# Patient Record
Sex: Female | Born: 1970 | Race: White | Hispanic: No | Marital: Married
Health system: Southern US, Community
[De-identification: ages and names within clinical notes are randomized; demographics above are authoritative.]

---

## 2017-08-29 ENCOUNTER — Ambulatory Visit: Payer: Self-pay | Admitting: Adult Health

## 2019-01-15 ENCOUNTER — Other Ambulatory Visit: Payer: Self-pay | Admitting: Obstetrics and Gynecology

## 2019-01-15 DIAGNOSIS — N6310 Unspecified lump in the right breast, unspecified quadrant: Secondary | ICD-10-CM

## 2019-01-16 ENCOUNTER — Encounter (INDEPENDENT_AMBULATORY_CARE_PROVIDER_SITE_OTHER): Payer: Self-pay

## 2019-01-16 ENCOUNTER — Ambulatory Visit
Admission: RE | Admit: 2019-01-16 | Discharge: 2019-01-16 | Disposition: A | Discharge status: Home / Self Care | Payer: No Typology Code available for payment source | Source: Ambulatory Visit | Attending: Obstetrics and Gynecology | Admitting: Obstetrics and Gynecology

## 2019-01-16 ENCOUNTER — Other Ambulatory Visit: Payer: Self-pay | Admitting: Obstetrics and Gynecology

## 2019-01-16 ENCOUNTER — Other Ambulatory Visit: Payer: Self-pay

## 2019-01-16 DIAGNOSIS — N6310 Unspecified lump in the right breast, unspecified quadrant: Secondary | ICD-10-CM

## 2019-01-16 DIAGNOSIS — N6320 Unspecified lump in the left breast, unspecified quadrant: Secondary | ICD-10-CM

## 2019-01-22 ENCOUNTER — Other Ambulatory Visit: Payer: Self-pay

## 2019-02-09 ENCOUNTER — Other Ambulatory Visit: Payer: Self-pay

## 2019-12-21 ENCOUNTER — Other Ambulatory Visit: Payer: Self-pay | Admitting: Obstetrics and Gynecology

## 2019-12-21 DIAGNOSIS — Z1231 Encounter for screening mammogram for malignant neoplasm of breast: Secondary | ICD-10-CM

## 2020-01-15 ENCOUNTER — Ambulatory Visit
Admission: RE | Admit: 2020-01-15 | Discharge: 2020-01-15 | Disposition: A | Discharge status: Home / Self Care | Payer: Self-pay | Source: Ambulatory Visit

## 2020-01-15 ENCOUNTER — Other Ambulatory Visit: Payer: Self-pay

## 2020-01-15 DIAGNOSIS — Z1231 Encounter for screening mammogram for malignant neoplasm of breast: Secondary | ICD-10-CM

## 2020-04-28 IMAGING — US ULTRASOUND LEFT BREAST LIMITED
1 series · 10 of 10 positions shown · non-contrast
Comparison: None

CLINICAL DATA: Patient presents for evaluation of palpable
abnormality within the upper-outer right breast.

EXAM:
DIGITAL DIAGNOSTIC BILATERAL MAMMOGRAM WITH CAD AND TOMO
ULTRASOUND BILATERAL BREAST

[Series 1: ultrasound left breast limited · 0.07mm/px · 10 of 10 slices shown]
[im 1/10]
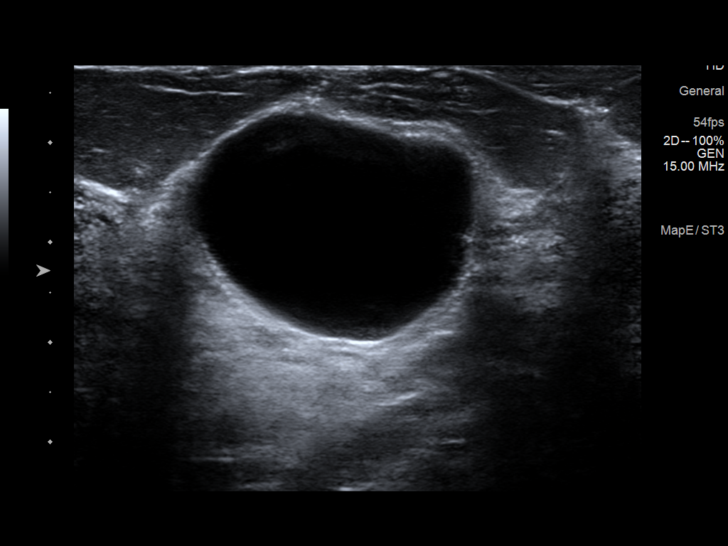
[im 2/10]
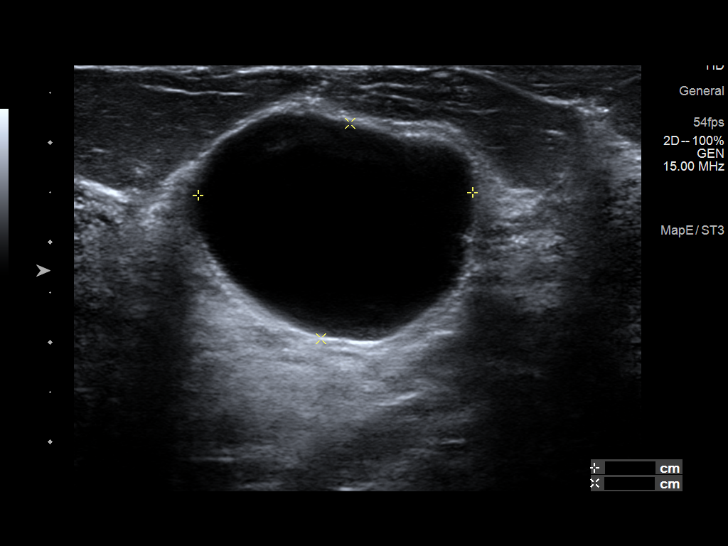
[im 3/10]
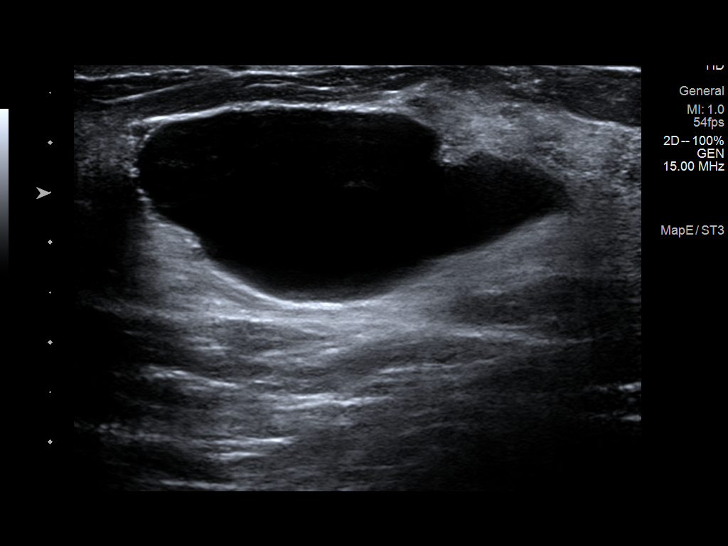
[im 4/10]
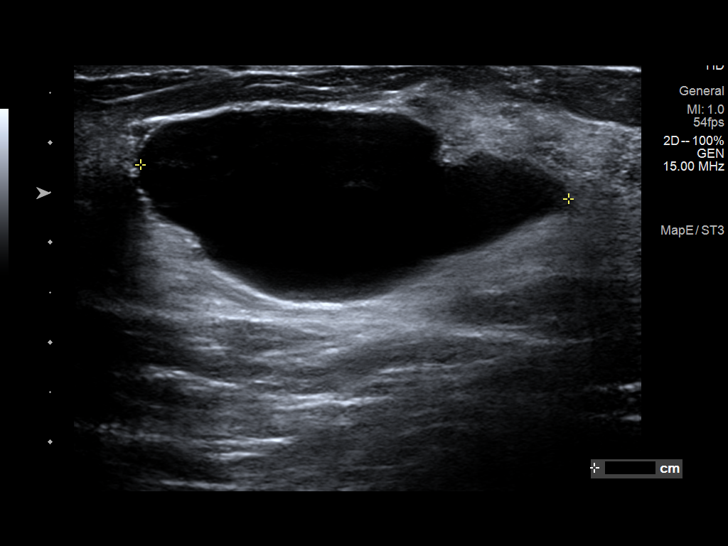
[im 5/10]
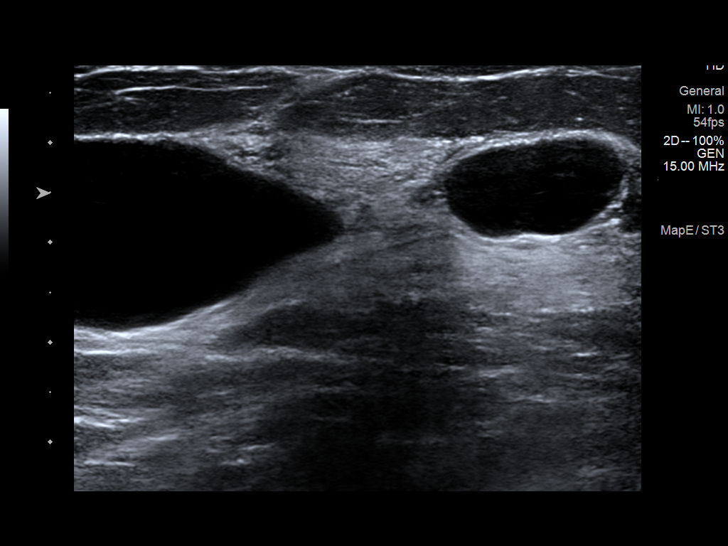
[im 6/10]
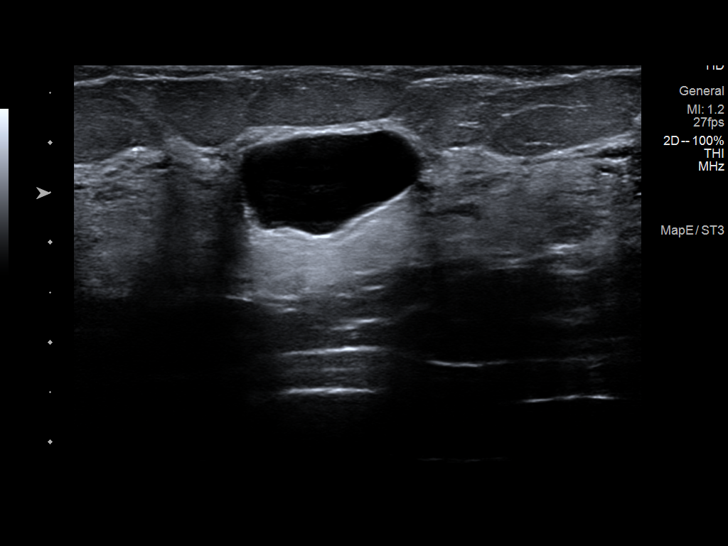
[im 7/10]
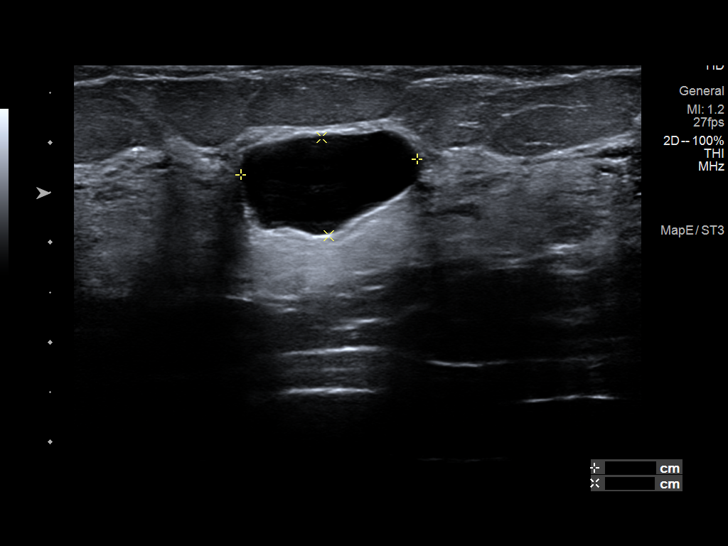
[im 8/10]
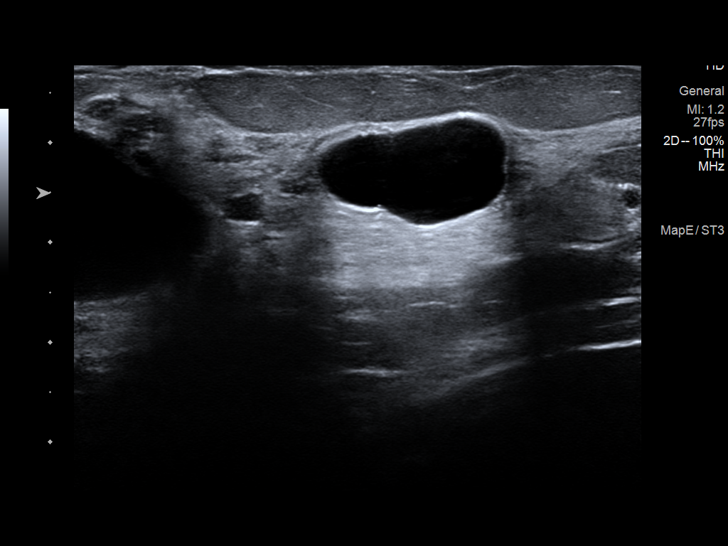
[im 9/10]
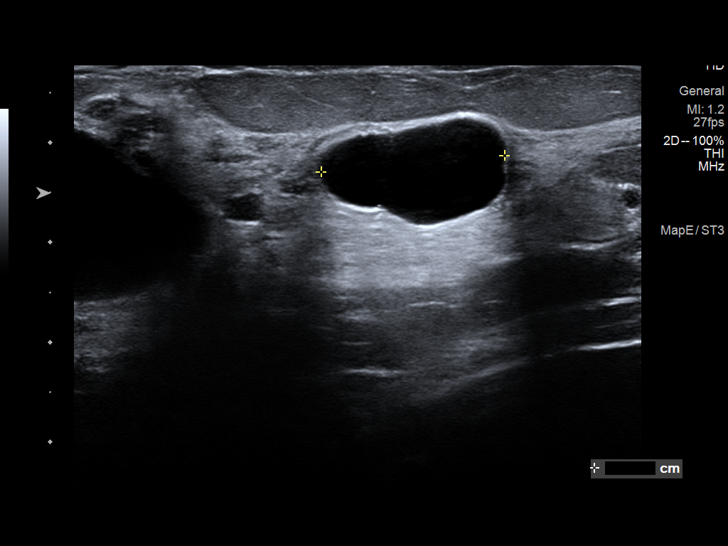
[im 10/10]
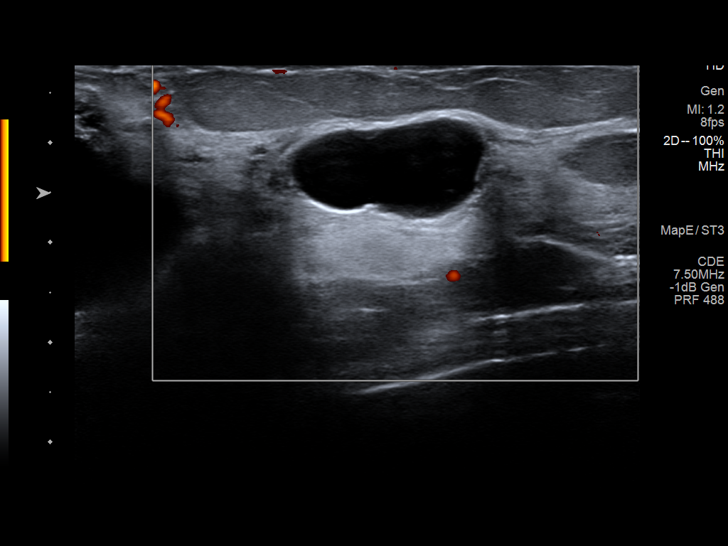

[10 of 10 positions shown; findings below may reference images not displayed]

ACR Breast Density Category c: The breast tissue is heterogeneously
dense, which may obscure small masses.
FINDINGS: Underlying the palpable marker within the upper-outer right breast
are multiple oval circumscribed masses. Additionally, there are
multiple oval circumscribed masses within the left breast. Given the
bilateral nature of these masses, these are suggestive of a benign
process. No suspicious calcifications or distortion within either
breast.

Mammographic images were processed with CAD.

Targeted ultrasound is performed, showing a 3.1 x 2.2 x 3.9 cm cyst
right breast 10 o'clock position 3 cm from nipple corresponding with
palpable abnormality.

There is an adjacent 3.2 x 1.5 x 3.0 cm simple cyst right breast 10
o'clock position 3 cm from the nipple.

There is a 2.9 x 1.5 x 3.9 cm simple cyst right breast 10 o'clock
position retroareolar location.

Within the left breast there is a 2.8 x 2.2 x 4.3 cm simple cyst 3
o'clock position 2 cm from nipple.

Within the left breast 3 o'clock position 4 cm from nipple there is
a 1.8 x 1.0 x 1.8 cm simple cyst.
IMPRESSION: Palpable abnormality within the right breast corresponds with a
benign simple cyst.

Additional bilateral benign appearing simple cysts are demonstrated.

No mammographic evidence for malignancy.

RECOMMENDATION:
Continued clinical evaluation for right breast palpable abnormality.

Screening mammogram in one year.(Code:UQ-7-MIX)

I have discussed the findings and recommendations with the patient.
Results were also provided in writing at the conclusion of the
visit. If applicable, a reminder letter will be sent to the patient
regarding the next appointment.

BI-RADS CATEGORY  2: Benign.

## 2020-10-03 ENCOUNTER — Telehealth: Payer: Self-pay | Admitting: *Deleted

## 2020-10-03 NOTE — Telephone Encounter (Signed)
Pt notified HR on 10/01/20 that her spouse tested positive for Covid. She reports that she does not have any sx but she is in close contact with him. HR Tim spoke with pt that day and encouraged her to quarantine separately from spouse and she reported she is able to so her Day 1 is 10/01/20.   Spoke with pt by phone. She confirms all of the information above. She also confirms still asymptomatic herself. Due to limited testing availability recommended testing around day 5 but not required to return at this time as long as she remains asymptomatic.   Day 5 10/05/20. RTW 10/06/20 with 5 days additional strict mask use

## 2020-10-04 NOTE — Telephone Encounter (Signed)
Noted reviewed RN Rolly Salter note agree with plan of care

## 2020-10-06 NOTE — Telephone Encounter (Signed)
Confirmed with pt that she did RTW today. She feels well. Asymptomatic. Strict mask use thru 10/10/20. Not testing performed due to limited availability. No further needs or concerns. Closing encounter.

## 2020-10-06 NOTE — Telephone Encounter (Signed)
Noted patient returned to work asymptomatic.  Reviewed RN Rolly Salter note agree with plan of care.

## 2020-12-19 ENCOUNTER — Other Ambulatory Visit: Payer: Self-pay | Admitting: Obstetrics and Gynecology

## 2020-12-19 DIAGNOSIS — Z0000 Encounter for general adult medical examination without abnormal findings: Secondary | ICD-10-CM

## 2020-12-20 ENCOUNTER — Other Ambulatory Visit: Payer: Self-pay | Admitting: Obstetrics and Gynecology

## 2020-12-20 DIAGNOSIS — Z1231 Encounter for screening mammogram for malignant neoplasm of breast: Secondary | ICD-10-CM

## 2021-01-05 ENCOUNTER — Telehealth: Payer: Self-pay | Admitting: *Deleted

## 2021-01-05 NOTE — Telephone Encounter (Signed)
RN notified by HR that pt left work on Monday afternoon due to nausea. Was notified that same day that she was at a household over the weekend with someone with GI sx and she was unaware.  Monday progressively worsened to nausea and diarrhea. All of her sx resolved by Wednesday morning, 4/6. She feels well today, 4/7 and is ready to RTW.  She took a home Covid test Monday that was negative. Asked pt to take one more home test today since Day 3 and if negative, since all sx resovled >24hr, she is cleared to RTW tomorrow 4/8. If positive or sx recur, she will stay out of work and contact clinic.  As of 1615, pt did not answer return call for 2nd test results. Left VM reminding of return rules as stated above. Will f/u tomorrow to ensure RTW.

## 2021-01-06 ENCOUNTER — Encounter: Payer: Self-pay | Admitting: *Deleted

## 2021-01-06 NOTE — Telephone Encounter (Signed)
Reviewed RN Rolly Salter note agreed with plan of care.  Per communication with RN Rolly Salter today patient had negative covid test last night and returned to work today 01/06/2021.

## 2021-01-13 ENCOUNTER — Other Ambulatory Visit: Payer: Self-pay | Admitting: Obstetrics and Gynecology

## 2021-01-13 DIAGNOSIS — Z1231 Encounter for screening mammogram for malignant neoplasm of breast: Secondary | ICD-10-CM

## 2021-01-16 ENCOUNTER — Other Ambulatory Visit: Payer: Self-pay

## 2021-01-16 ENCOUNTER — Ambulatory Visit
Admission: RE | Admit: 2021-01-16 | Discharge: 2021-01-16 | Disposition: A | Discharge status: Home / Self Care | Payer: PRIVATE HEALTH INSURANCE | Source: Ambulatory Visit

## 2021-01-16 DIAGNOSIS — Z1231 Encounter for screening mammogram for malignant neoplasm of breast: Secondary | ICD-10-CM

## 2021-01-17 ENCOUNTER — Other Ambulatory Visit: Payer: Self-pay | Admitting: Obstetrics and Gynecology

## 2021-01-17 DIAGNOSIS — R928 Other abnormal and inconclusive findings on diagnostic imaging of breast: Secondary | ICD-10-CM

## 2021-01-19 ENCOUNTER — Ambulatory Visit
Admission: RE | Admit: 2021-01-19 | Discharge: 2021-01-19 | Disposition: A | Discharge status: Home / Self Care | Payer: PRIVATE HEALTH INSURANCE | Source: Ambulatory Visit | Attending: Obstetrics and Gynecology | Admitting: Obstetrics and Gynecology

## 2021-01-19 ENCOUNTER — Other Ambulatory Visit: Payer: Self-pay

## 2021-01-19 ENCOUNTER — Other Ambulatory Visit: Payer: Self-pay | Admitting: Obstetrics and Gynecology

## 2021-01-19 DIAGNOSIS — R928 Other abnormal and inconclusive findings on diagnostic imaging of breast: Secondary | ICD-10-CM

## 2021-02-10 ENCOUNTER — Ambulatory Visit: Payer: No Typology Code available for payment source

## 2021-07-27 ENCOUNTER — Other Ambulatory Visit: Payer: Self-pay

## 2021-07-27 ENCOUNTER — Ambulatory Visit
Admission: RE | Admit: 2021-07-27 | Discharge: 2021-07-27 | Disposition: A | Discharge status: Home / Self Care | Payer: BC Managed Care – PPO | Source: Ambulatory Visit | Attending: Obstetrics and Gynecology | Admitting: Obstetrics and Gynecology

## 2021-07-27 ENCOUNTER — Other Ambulatory Visit: Payer: Self-pay | Admitting: Obstetrics and Gynecology

## 2021-07-27 DIAGNOSIS — R928 Other abnormal and inconclusive findings on diagnostic imaging of breast: Secondary | ICD-10-CM

## 2022-01-26 ENCOUNTER — Other Ambulatory Visit: Payer: BC Managed Care – PPO

## 2022-02-05 ENCOUNTER — Ambulatory Visit
Admission: RE | Admit: 2022-02-05 | Discharge: 2022-02-05 | Disposition: A | Discharge status: Home / Self Care | Payer: BC Managed Care – PPO | Source: Ambulatory Visit | Attending: Obstetrics and Gynecology | Admitting: Obstetrics and Gynecology

## 2022-02-05 DIAGNOSIS — R928 Other abnormal and inconclusive findings on diagnostic imaging of breast: Secondary | ICD-10-CM

## 2023-04-16 ENCOUNTER — Other Ambulatory Visit: Payer: Self-pay | Admitting: Obstetrics and Gynecology

## 2023-04-16 DIAGNOSIS — N6001 Solitary cyst of right breast: Secondary | ICD-10-CM

## 2023-04-16 DIAGNOSIS — N6310 Unspecified lump in the right breast, unspecified quadrant: Secondary | ICD-10-CM

## 2023-04-18 ENCOUNTER — Encounter: Payer: Self-pay | Admitting: Obstetrics and Gynecology

## 2023-04-24 LAB — COLOGUARD: COLOGUARD: NEGATIVE

## 2023-04-25 DIAGNOSIS — Z1231 Encounter for screening mammogram for malignant neoplasm of breast: Secondary | ICD-10-CM

## 2023-05-01 ENCOUNTER — Ambulatory Visit
Admission: RE | Admit: 2023-05-01 | Discharge: 2023-05-01 | Disposition: A | Discharge status: Home / Self Care | Payer: BC Managed Care – PPO | Source: Ambulatory Visit | Attending: Obstetrics and Gynecology | Admitting: Obstetrics and Gynecology

## 2023-05-01 DIAGNOSIS — N6310 Unspecified lump in the right breast, unspecified quadrant: Secondary | ICD-10-CM

## 2023-05-01 DIAGNOSIS — N6001 Solitary cyst of right breast: Secondary | ICD-10-CM

## 2023-05-19 IMAGING — MG DIGITAL DIAGNOSTIC BILAT W/ TOMO W/ CAD
8 series · 8 of 24 positions shown · non-contrast
Comparison: Previous exam(s).

CLINICAL DATA: One year follow-up of probably benign RIGHT breast
clustered cysts.

EXAM:
DIGITAL DIAGNOSTIC BILATERAL MAMMOGRAM WITH TOMOSYNTHESIS AND CAD;
ULTRASOUND RIGHT BREAST LIMITED
TECHNIQUE: Bilateral digital diagnostic mammography and breast tomosynthesis
was performed. The images were evaluated with computer-aided
detection.; Targeted ultrasound examination of the right breast was
performed

[L MLO synth-2D]
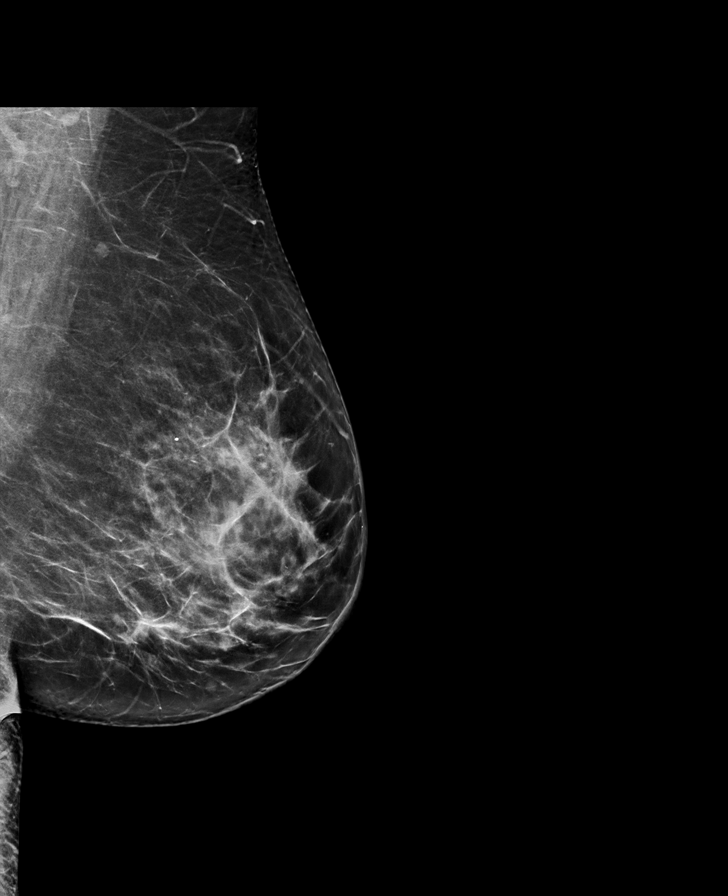

[R CC synth-2D]
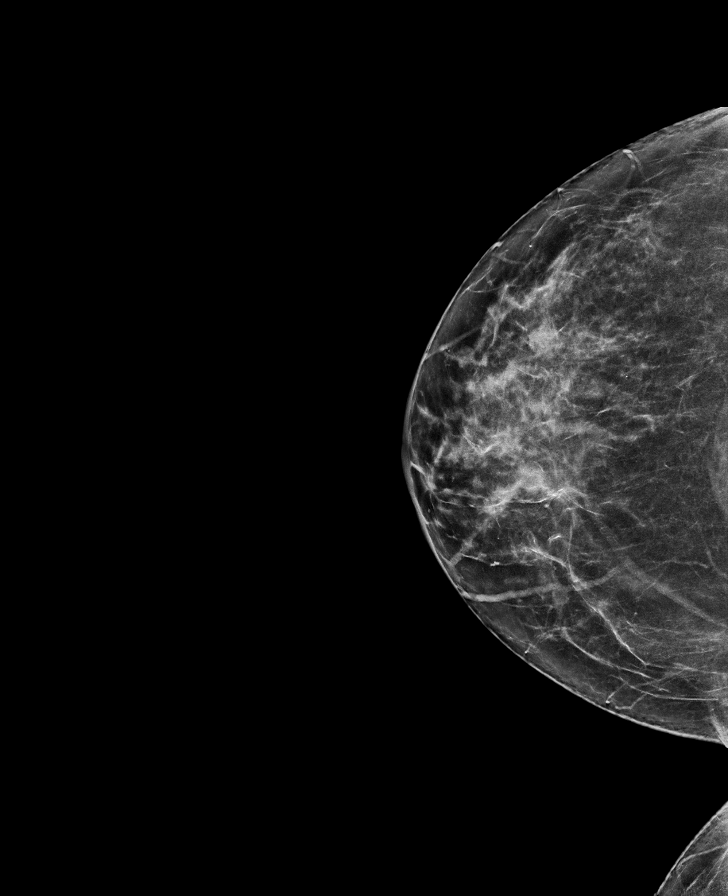

[L CC synth-2D]
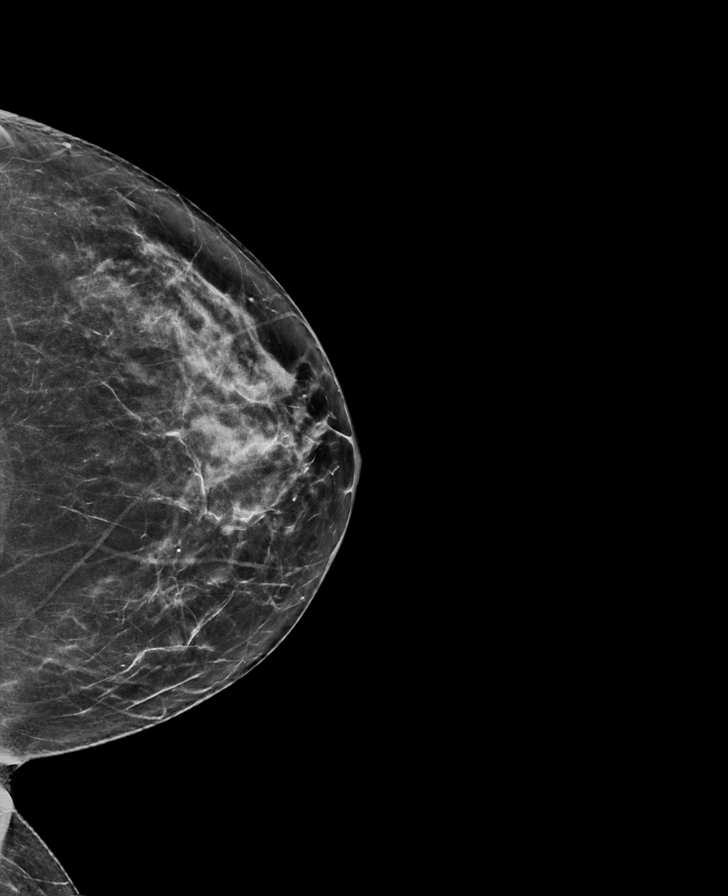

[R MLO synth-2D]
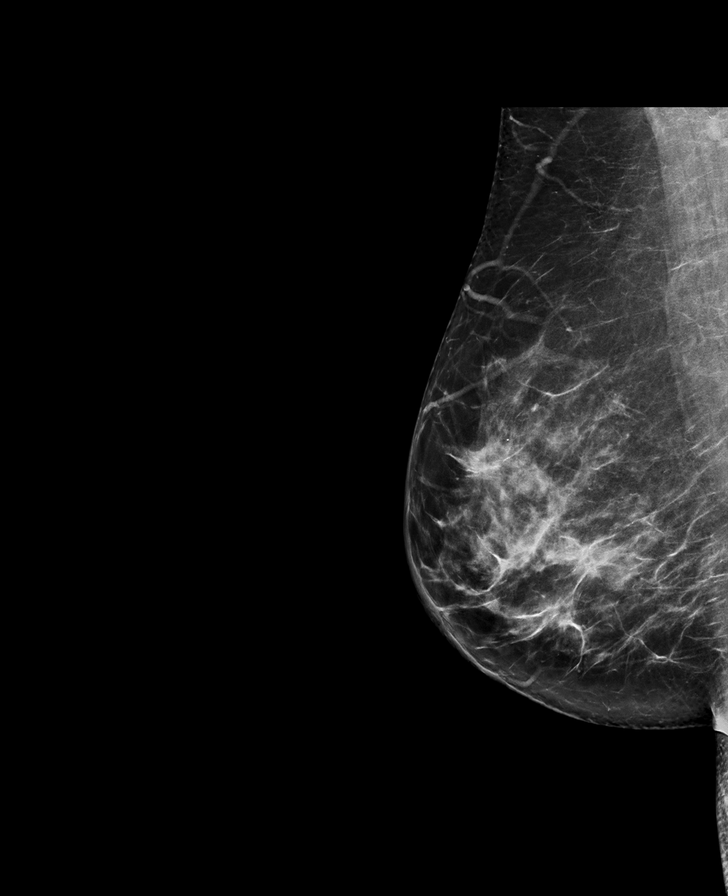

[L MLO tomo · tomo slice 38/75.0]
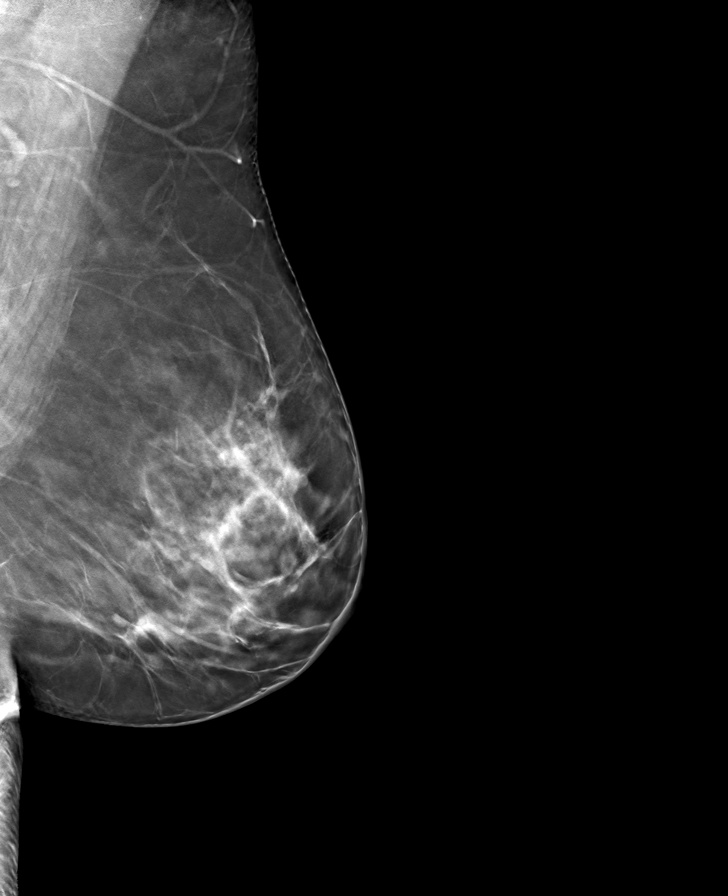

[R CC tomo · tomo slice 32/63.0]
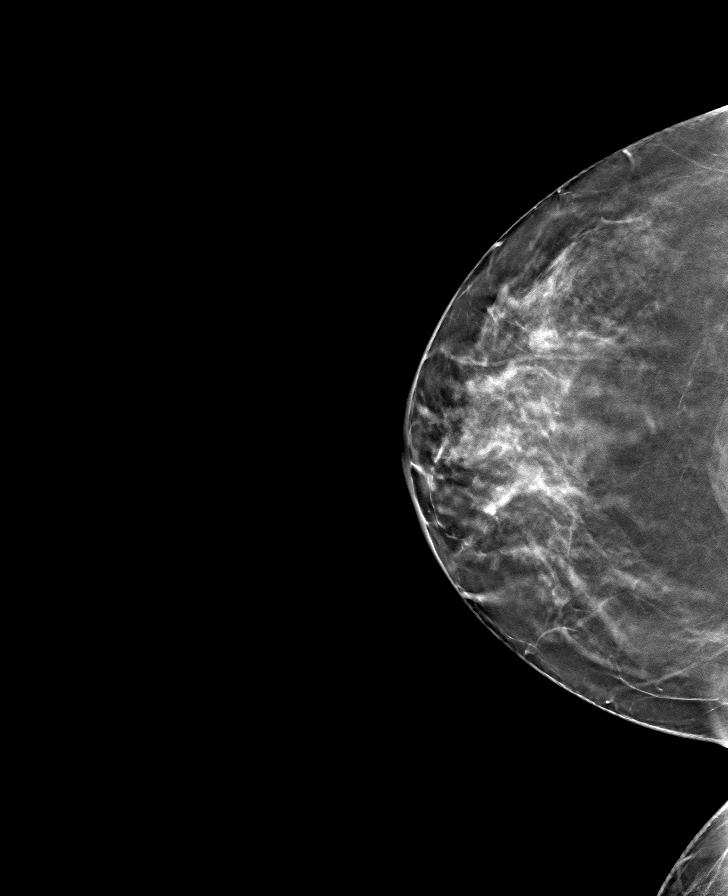

[R MLO tomo · tomo slice 38/75.0]
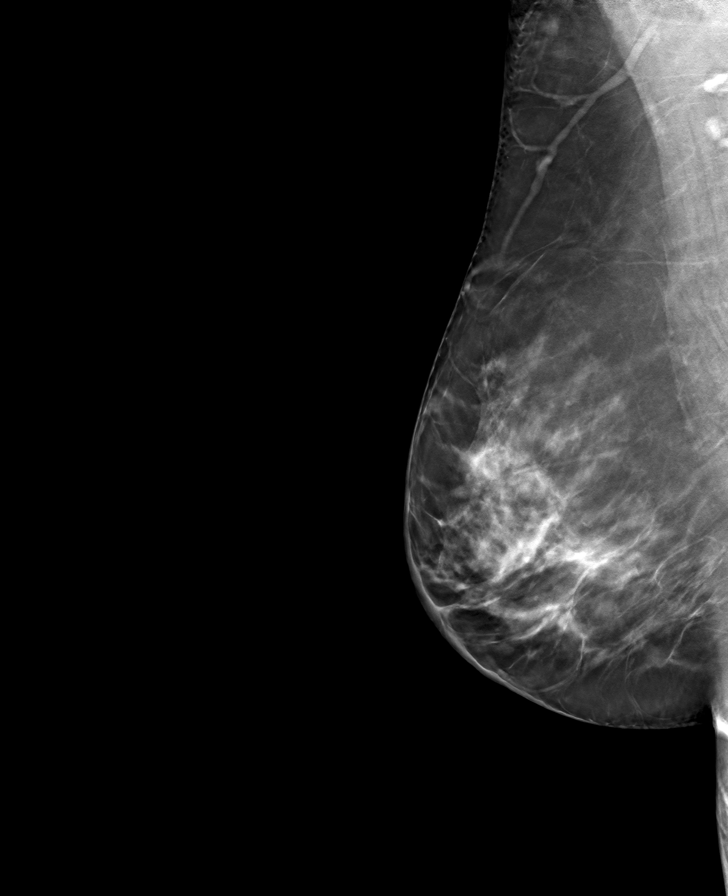

[L CC tomo · tomo slice 33/66.0]
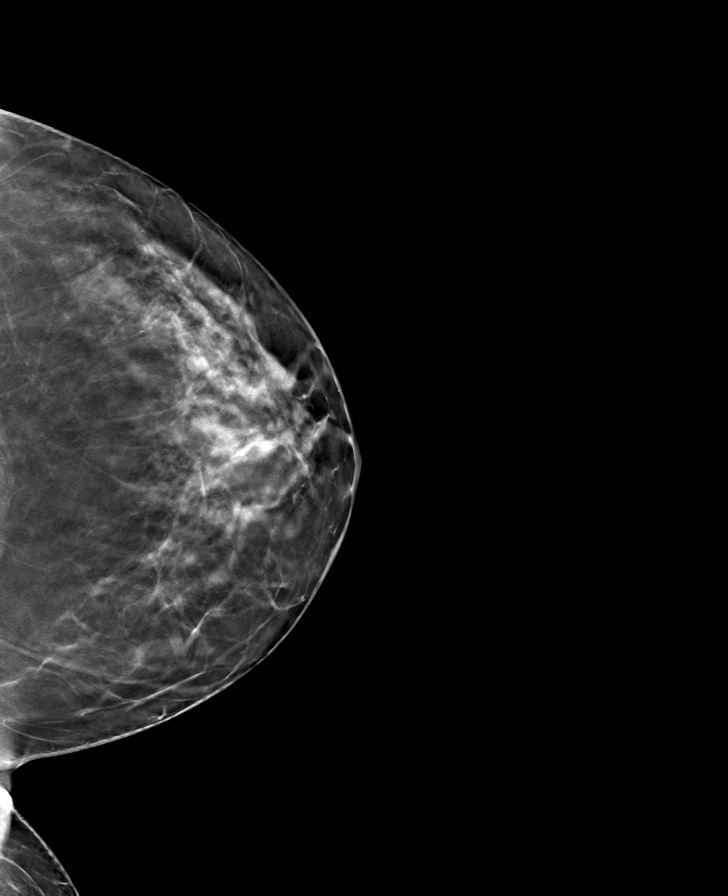

[8 of 24 positions shown; findings below may reference images not displayed]

ACR Breast Density Category c: The breast tissue is heterogeneously
dense, which may obscure small masses.
FINDINGS: No suspicious mass, distortion, or microcalcifications are
identified to suggest presence of malignancy.

Targeted ultrasound is performed, showing a group of microcysts in
the 8 o'clock location of the RIGHT breast 1 centimeter from the
nipple, measuring 0.6 x 0.7 x 0 centimeters. Previously, this lesion
measured 0.7 x 0.8 x 0 5 centimeters. There is no solid component or
areas of acoustic shadowing.
IMPRESSION: 1.  No mammographic or ultrasound evidence for malignancy.
2. Stable appearance of probably benign mass in the 8 o'clock
location of the RIGHT breast, likely apocrine metaplasia/fibrocystic
changes.

RECOMMENDATION:
Recommend bilateral diagnostic mammogram and RIGHT breast ultrasound
in 1 year to complete follow-up.

I have discussed the findings and recommendations with the patient.
If applicable, a reminder letter will be sent to the patient
regarding the next appointment.

BI-RADS CATEGORY  3: Probably benign.

## 2024-05-12 ENCOUNTER — Other Ambulatory Visit: Payer: Self-pay | Admitting: Obstetrics and Gynecology

## 2024-05-12 DIAGNOSIS — Z1231 Encounter for screening mammogram for malignant neoplasm of breast: Secondary | ICD-10-CM

## 2024-05-25 ENCOUNTER — Ambulatory Visit
Admission: RE | Admit: 2024-05-25 | Discharge: 2024-05-25 | Disposition: A | Discharge status: Home / Self Care | Source: Ambulatory Visit

## 2024-05-25 DIAGNOSIS — Z1231 Encounter for screening mammogram for malignant neoplasm of breast: Secondary | ICD-10-CM
